# Patient Record
Sex: Female | Born: 1970 | Race: White | Hispanic: No | Smoking: Former smoker
Health system: Southern US, Community
[De-identification: ages and names within clinical notes are randomized; demographics above are authoritative.]

## PROBLEM LIST (undated history)

## (undated) DIAGNOSIS — D649 Anemia, unspecified: Secondary | ICD-10-CM

## (undated) DIAGNOSIS — G8929 Other chronic pain: Secondary | ICD-10-CM

## (undated) DIAGNOSIS — R569 Unspecified convulsions: Secondary | ICD-10-CM

## (undated) DIAGNOSIS — O2230 Deep phlebothrombosis in pregnancy, unspecified trimester: Secondary | ICD-10-CM

## (undated) DIAGNOSIS — M549 Dorsalgia, unspecified: Secondary | ICD-10-CM

## (undated) HISTORY — PX: LEG SURGERY: SHX1003

---

## 2007-10-25 ENCOUNTER — Emergency Department: Payer: Self-pay | Admitting: Emergency Medicine

## 2007-10-25 ENCOUNTER — Other Ambulatory Visit: Payer: Self-pay

## 2009-09-15 ENCOUNTER — Emergency Department: Payer: Self-pay | Admitting: Emergency Medicine

## 2010-10-05 ENCOUNTER — Emergency Department: Payer: Self-pay | Admitting: Emergency Medicine

## 2015-10-28 ENCOUNTER — Emergency Department: Payer: No Typology Code available for payment source

## 2015-10-28 ENCOUNTER — Encounter: Payer: Self-pay | Admitting: Emergency Medicine

## 2015-10-28 ENCOUNTER — Emergency Department
Admission: EM | Admit: 2015-10-28 | Discharge: 2015-10-28 | Disposition: A | Discharge status: Home / Self Care | Payer: No Typology Code available for payment source | Attending: Emergency Medicine | Admitting: Emergency Medicine

## 2015-10-28 DIAGNOSIS — S40011A Contusion of right shoulder, initial encounter: Secondary | ICD-10-CM | POA: Diagnosis not present

## 2015-10-28 DIAGNOSIS — Z87891 Personal history of nicotine dependence: Secondary | ICD-10-CM | POA: Insufficient documentation

## 2015-10-28 DIAGNOSIS — Y939 Activity, unspecified: Secondary | ICD-10-CM | POA: Diagnosis not present

## 2015-10-28 DIAGNOSIS — S7001XA Contusion of right hip, initial encounter: Secondary | ICD-10-CM

## 2015-10-28 DIAGNOSIS — S161XXA Strain of muscle, fascia and tendon at neck level, initial encounter: Secondary | ICD-10-CM | POA: Diagnosis not present

## 2015-10-28 DIAGNOSIS — S39012A Strain of muscle, fascia and tendon of lower back, initial encounter: Secondary | ICD-10-CM | POA: Diagnosis not present

## 2015-10-28 DIAGNOSIS — S199XXA Unspecified injury of neck, initial encounter: Secondary | ICD-10-CM | POA: Diagnosis present

## 2015-10-28 DIAGNOSIS — Y999 Unspecified external cause status: Secondary | ICD-10-CM | POA: Insufficient documentation

## 2015-10-28 DIAGNOSIS — Y929 Unspecified place or not applicable: Secondary | ICD-10-CM | POA: Insufficient documentation

## 2015-10-28 MED ORDER — HYDROCODONE-ACETAMINOPHEN 5-325 MG PO TABS: 1.0000 | ORAL_TABLET | Freq: Four times a day (QID) | ORAL | Status: DC | PRN

## 2015-10-28 MED ORDER — HYDROCODONE-ACETAMINOPHEN 5-325 MG PO TABS
1.0000 | ORAL_TABLET | ORAL | Status: AC
  Administered 2015-10-28: 1 via ORAL
  Filled 2015-10-28: qty 1

## 2015-10-28 MED ORDER — DIAZEPAM 5 MG PO TABS
5.0000 mg | ORAL_TABLET | Freq: Once | ORAL | Status: AC
  Administered 2015-10-28: 5 mg via ORAL
  Filled 2015-10-28: qty 1

## 2015-10-28 MED ORDER — IBUPROFEN 800 MG PO TABS: 800.0000 mg | ORAL_TABLET | Freq: Three times a day (TID) | ORAL | Status: DC | PRN

## 2015-10-28 MED ORDER — IBUPROFEN 800 MG PO TABS
800.0000 mg | ORAL_TABLET | Freq: Once | ORAL | Status: AC
  Administered 2015-10-28: 800 mg via ORAL
  Filled 2015-10-28: qty 1

## 2015-10-28 MED ORDER — DIAZEPAM 5 MG PO TABS: 5.0000 mg | ORAL_TABLET | Freq: Four times a day (QID) | ORAL | Status: DC | PRN
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
## 2016-12-22 ENCOUNTER — Inpatient Hospital Stay
Admission: EM | Admit: 2016-12-22 | Discharge: 2016-12-23 | DRG: 690 | Disposition: A | Discharge status: Home / Self Care | Payer: Self-pay | Attending: Internal Medicine | Admitting: Internal Medicine

## 2016-12-22 ENCOUNTER — Encounter: Payer: Self-pay | Admitting: Emergency Medicine

## 2016-12-22 ENCOUNTER — Emergency Department: Payer: Self-pay

## 2016-12-22 DIAGNOSIS — Z825 Family history of asthma and other chronic lower respiratory diseases: Secondary | ICD-10-CM

## 2016-12-22 DIAGNOSIS — R319 Hematuria, unspecified: Secondary | ICD-10-CM | POA: Diagnosis present

## 2016-12-22 DIAGNOSIS — N10 Acute tubulo-interstitial nephritis: Principal | ICD-10-CM | POA: Diagnosis present

## 2016-12-22 DIAGNOSIS — Z87891 Personal history of nicotine dependence: Secondary | ICD-10-CM

## 2016-12-22 DIAGNOSIS — B9620 Unspecified Escherichia coli [E. coli] as the cause of diseases classified elsewhere: Secondary | ICD-10-CM | POA: Diagnosis present

## 2016-12-22 DIAGNOSIS — N12 Tubulo-interstitial nephritis, not specified as acute or chronic: Secondary | ICD-10-CM

## 2016-12-22 DIAGNOSIS — E871 Hypo-osmolality and hyponatremia: Secondary | ICD-10-CM | POA: Diagnosis present

## 2016-12-22 DIAGNOSIS — E860 Dehydration: Secondary | ICD-10-CM | POA: Diagnosis present

## 2016-12-22 DIAGNOSIS — F329 Major depressive disorder, single episode, unspecified: Secondary | ICD-10-CM | POA: Diagnosis present

## 2016-12-22 HISTORY — DX: Dorsalgia, unspecified: M54.9

## 2016-12-22 HISTORY — DX: Anemia, unspecified: D64.9

## 2016-12-22 HISTORY — DX: Other chronic pain: G89.29

## 2016-12-22 HISTORY — DX: Unspecified convulsions: R56.9

## 2016-12-22 HISTORY — DX: Deep phlebothrombosis in pregnancy, unspecified trimester: O22.30

## 2016-12-22 LAB — URINALYSIS, COMPLETE (UACMP) WITH MICROSCOPIC
BILIRUBIN URINE: NEGATIVE
Glucose, UA: NEGATIVE mg/dL
KETONES UR: 80 mg/dL — AB
Nitrite: POSITIVE — AB
Protein, ur: 100 mg/dL — AB
Specific Gravity, Urine: 1.016 (ref 1.005–1.030)
pH: 7 (ref 5.0–8.0)

## 2016-12-22 LAB — CBC
HCT: 36.4 % (ref 35.0–47.0)
HEMOGLOBIN: 12.6 g/dL (ref 12.0–16.0)
MCH: 31.7 pg (ref 26.0–34.0)
MCHC: 34.6 g/dL (ref 32.0–36.0)
MCV: 91.8 fL (ref 80.0–100.0)
Platelets: 215 10*3/uL (ref 150–440)
RBC: 3.96 MIL/uL (ref 3.80–5.20)
RDW: 12 % (ref 11.5–14.5)
WBC: 13.5 10*3/uL — ABNORMAL HIGH (ref 3.6–11.0)

## 2016-12-22 LAB — COMPREHENSIVE METABOLIC PANEL
ALBUMIN: 3.7 g/dL (ref 3.5–5.0)
ALT: 16 U/L (ref 14–54)
ANION GAP: 9 (ref 5–15)
AST: 19 U/L (ref 15–41)
Alkaline Phosphatase: 54 U/L (ref 38–126)
BUN: 9 mg/dL (ref 6–20)
CHLORIDE: 103 mmol/L (ref 101–111)
CO2: 20 mmol/L — ABNORMAL LOW (ref 22–32)
CREATININE: 0.58 mg/dL (ref 0.44–1.00)
Calcium: 8.8 mg/dL — ABNORMAL LOW (ref 8.9–10.3)
GFR calc non Af Amer: >60 mL/min (ref >60)
Glucose, Bld: 124 mg/dL — ABNORMAL HIGH (ref 65–99)
Potassium: 3.5 mmol/L (ref 3.5–5.1)
Sodium: 132 mmol/L — ABNORMAL LOW (ref 135–145)
Total Bilirubin: 1.4 mg/dL — ABNORMAL HIGH (ref 0.3–1.2)
Total Protein: 7.6 g/dL (ref 6.5–8.1)

## 2016-12-22 LAB — LIPASE, BLOOD: LIPASE: 21 U/L (ref 11–51)

## 2016-12-22 LAB — LACTIC ACID, PLASMA: LACTIC ACID, VENOUS: 0.6 mmol/L (ref 0.5–1.9)

## 2016-12-22 MED ORDER — ONDANSETRON HCL 4 MG PO TABS: 4.0000 mg | ORAL_TABLET | Freq: Four times a day (QID) | ORAL | Status: DC | PRN

## 2016-12-22 MED ORDER — POLYETHYLENE GLYCOL 3350 17 G PO PACK: 17.0000 g | PACK | Freq: Every day | ORAL | Status: DC | PRN

## 2016-12-22 MED ORDER — DEXTROSE 5 % IV SOLN
2.0000 g | Freq: Once | INTRAVENOUS | Status: AC
  Administered 2016-12-22: 2 g via INTRAVENOUS
  Filled 2016-12-22: qty 2

## 2016-12-22 MED ORDER — ENOXAPARIN SODIUM 40 MG/0.4ML ~~LOC~~ SOLN
40.0000 mg | SUBCUTANEOUS | Status: DC
  Filled 2016-12-22: qty 0.4

## 2016-12-22 MED ORDER — SODIUM CHLORIDE 0.9 % IV SOLN
Freq: Once | INTRAVENOUS | Status: AC
  Administered 2016-12-22: 14:00:00 via INTRAVENOUS

## 2016-12-22 MED ORDER — ACETAMINOPHEN 325 MG PO TABS
650.0000 mg | ORAL_TABLET | Freq: Four times a day (QID) | ORAL | Status: DC | PRN
  Administered 2016-12-22: 650 mg via ORAL
  Filled 2016-12-22: qty 2

## 2016-12-22 MED ORDER — ALBUTEROL SULFATE (2.5 MG/3ML) 0.083% IN NEBU: 2.5000 mg | INHALATION_SOLUTION | RESPIRATORY_TRACT | Status: DC | PRN

## 2016-12-22 MED ORDER — ONDANSETRON HCL 4 MG/2ML IJ SOLN: 4.0000 mg | Freq: Four times a day (QID) | INTRAMUSCULAR | Status: DC | PRN

## 2016-12-22 MED ORDER — POTASSIUM CHLORIDE IN NACL 20-0.9 MEQ/L-% IV SOLN
INTRAVENOUS | Status: DC
  Administered 2016-12-22 – 2016-12-23 (×2): via INTRAVENOUS
  Filled 2016-12-22 (×3): qty 1000

## 2016-12-22 MED ORDER — TRAMADOL HCL 50 MG PO TABS: 50.0000 mg | ORAL_TABLET | Freq: Four times a day (QID) | ORAL | Status: DC | PRN

## 2016-12-22 MED ORDER — SODIUM CHLORIDE 0.9 % IV BOLUS (SEPSIS)
1000.0000 mL | Freq: Once | INTRAVENOUS | Status: AC
  Administered 2016-12-22: 1000 mL via INTRAVENOUS

## 2016-12-22 MED ORDER — MORPHINE SULFATE (PF) 4 MG/ML IV SOLN
4.0000 mg | Freq: Once | INTRAVENOUS | Status: AC
  Administered 2016-12-22: 4 mg via INTRAVENOUS

## 2016-12-22 MED ORDER — IOPAMIDOL (ISOVUE-300) INJECTION 61%
100.0000 mL | Freq: Once | INTRAVENOUS | Status: AC | PRN
  Administered 2016-12-22: 100 mL via INTRAVENOUS

## 2016-12-22 MED ORDER — KETOROLAC TROMETHAMINE 30 MG/ML IJ SOLN
30.0000 mg | Freq: Four times a day (QID) | INTRAMUSCULAR | Status: DC | PRN
  Administered 2016-12-22 – 2016-12-23 (×2): 30 mg via INTRAVENOUS
  Filled 2016-12-22 (×2): qty 1

## 2016-12-22 MED ORDER — ACETAMINOPHEN 650 MG RE SUPP: 650.0000 mg | Freq: Four times a day (QID) | RECTAL | Status: DC | PRN

## 2016-12-22 MED ORDER — DEXTROSE 5 % IV SOLN
1.0000 g | INTRAVENOUS | Status: DC
  Filled 2016-12-22: qty 10

## 2016-12-22 MED ORDER — ENSURE ENLIVE PO LIQD
237.0000 mL | Freq: Two times a day (BID) | ORAL | Status: DC
  Administered 2016-12-22: 237 mL via ORAL

## 2016-12-22 MED ORDER — DULOXETINE HCL 60 MG PO CPEP: 120.0000 mg | ORAL_CAPSULE | Freq: Every day | ORAL | Status: DC

## 2016-12-22 MED ORDER — ONDANSETRON HCL 4 MG/2ML IJ SOLN
4.0000 mg | Freq: Once | INTRAMUSCULAR | Status: AC
  Administered 2016-12-22: 4 mg via INTRAVENOUS

## 2016-12-22 NOTE — ED Triage Notes (Signed)
Pt here with c/o cough, fever N,V for the past few days, was sent here from Dr office for fluids and to be started on antibiotic for UTI. Pt has been unable to keep anything down since yesterday am.

## 2016-12-22 NOTE — H&P (Signed)
[facility] - [place] [facility] [place]   PATIENT NAME: Kristi Lyons    MR#:  [id number]  DATE OF BIRTH:  [birth year]  DATE OF ADMISSION:  12/22/2016  PRIMARY CARE PHYSICIAN: Evie Lacks, NP   REQUESTING/REFERRING PHYSICIAN: Dr. Mayford Knife  CHIEF COMPLAINT:   Chief Complaint  Patient presents with  . Fever  . Cough  . Emesis    HISTORY OF PRESENT ILLNESS:  Kristi Lyons  is a 46 y.o. female with a known history of Depression, chronic back pain presents to the hospital complaining of 3-4 days of nausea, vomiting and right-sided back pain. Patient was seen by her primary care physician today in the office and found to have a UTI and referred to the emergency room. Patient has also had intractable vomiting and been able to keep only a few sips of water. Had dark urine. Some blood in the urine. Afebrile. Here patient has been found to have leukocytosis along with tachycardia. CT scan of the abdomen and pelvis showed right pyelonephritis. Sepsis present on admission. Admit inpatient.  PAST MEDICAL HISTORY:   Past Medical History:  Diagnosis Date  . Anemia   . Chronic back pain   . DVT (deep vein thrombosis) in pregnancy (HCC)   . Seizure (HCC)     PAST SURGICAL HISTORY:   Past Surgical History:  Procedure Laterality Date  . LEG SURGERY    . LEG SURGERY      SOCIAL HISTORY:   Social History  Substance Use Topics  . Smoking status: Former Games developer  . Smokeless tobacco: Never Used  . Alcohol use No    FAMILY HISTORY:   Family History  Problem Relation Age of Onset  . Cancer Mother   . COPD Father     DRUG ALLERGIES:  No Known Allergies  REVIEW OF SYSTEMS:   ROS  MEDICATIONS AT HOME:   Prior to Admission medications   Medication Sig Start Date End Date Taking? Authorizing Provider  acetaminophen (TYLENOL) 500 MG tablet Take 500 mg by mouth every 6 (six) hours as needed.   Yes [provider]  DULoxetine (CYMBALTA) 60 MG capsule Take 120  mg by mouth daily.   Yes [provider]  diazepam (VALIUM) 5 MG tablet Take 1 tablet (5 mg total) by mouth every 6 (six) hours as needed for anxiety. Patient not taking: Reported on 12/22/2016 10/28/15   Evon Slack, PA-C  HYDROcodone-acetaminophen [facility]) 5-325 MG tablet Take 1 tablet by mouth every 6 (six) hours as needed for moderate pain. Patient not taking: Reported on 12/22/2016 10/28/15   Evon Slack, PA-C  ibuprofen (ADVIL,MOTRIN) 800 MG tablet Take 1 tablet (800 mg total) by mouth every 8 (eight) hours as needed. Patient not taking: Reported on 12/22/2016 10/28/15   Evon Slack, PA-C     VITAL SIGNS:  Blood pressure 111/72, pulse 88, temperature 98.6 F (37 C), temperature source Oral, resp. rate 16, height 5\' 7"  (1.702 m), weight 57.2 kg (126 lb), last menstrual period 12/04/2016, SpO2 99 %.  PHYSICAL EXAMINATION:  Physical Exam  GENERAL:  46 y.o.-year-old patient lying in the bed with no acute distress.  EYES: Pupils equal, round, reactive to light and accommodation. No scleral icterus. Extraocular muscles intact.  HEENT: Head atraumatic, normocephalic. Oropharynx and nasopharynx clear. No oropharyngeal erythema, dry oral mucosa  NECK:  Supple, no jugular venous distention. No thyroid enlargement, no tenderness.  LUNGS: Normal breath sounds bilaterally, no wheezing, rales, rhonchi. No use of accessory muscles  of respiration.  CARDIOVASCULAR: S1, S2 normal. No murmurs, rubs, or gallops.  ABDOMEN: Soft, nontender, nondistended. Bowel sounds present. No organomegaly or mass. Right CVA tenderness EXTREMITIES: No pedal edema, cyanosis, or clubbing. + 2 pedal & radial pulses b/l.   NEUROLOGIC: Cranial nerves II through XII are intact. No focal Motor or sensory deficits appreciated b/l PSYCHIATRIC: The patient is alert and oriented x 3. Good affect.  SKIN: No obvious rash, lesion, or ulcer.   LABORATORY PANEL:   CBC  Recent Labs Lab 12/22/16 1351  WBC 13.5*   HGB 12.6  HCT 36.4  PLT 215   ------------------------------------------------------------------------------------------------------------------  Chemistries   Recent Labs Lab 12/22/16 1351  NA 132*  K 3.5  CL 103  CO2 20*  GLUCOSE 124*  BUN 9  CREATININE 0.58  CALCIUM 8.8*  AST 19  ALT 16  ALKPHOS 54  BILITOT 1.4*   ------------------------------------------------------------------------------------------------------------------  Cardiac Enzymes No results for input(s): TROPONINI in the last 168 hours. ------------------------------------------------------------------------------------------------------------------  RADIOLOGY:  Dg Chest 2 View  Result Date: 12/22/2016 CLINICAL DATA:  Cough, fever, nausea and vomiting for the past few days. EXAM: CHEST  2 VIEW COMPARISON:  CT chest and PA and lateral chest 10/25/2007. FINDINGS: The lungs are clear. Heart size is normal. No pneumothorax or pleural fluid. No bony abnormality. IMPRESSION: Negative chest. Electronically Signed   By: Drusilla Kannerhomas  Dalessio M.D.   On: 12/22/2016 15:00   Ct Abdomen Pelvis W Contrast  Result Date: 12/22/2016 CLINICAL DATA:  Cough, fever, nausea, vomiting EXAM: CT ABDOMEN AND PELVIS WITH CONTRAST TECHNIQUE: Multidetector CT imaging of the abdomen and pelvis was performed using the standard protocol following bolus administration of intravenous contrast. CONTRAST:  100mL ISOVUE-300 IOPAMIDOL (ISOVUE-300) INJECTION 61% COMPARISON:  None. FINDINGS: Lower chest: No acute abnormality. Hepatobiliary: No focal liver abnormality is seen. No gallstones, gallbladder wall thickening, or biliary dilatation. Pancreas: Unremarkable. No pancreatic ductal dilatation or surrounding inflammatory changes. Spleen: Normal in size without focal abnormality. Adrenals/Urinary Tract: Adrenal glands are normal. Left kidney is normal. Mild right nephromegaly with wedge-shaped areas of hypoenhancement involving the inferior pole of the  right kidney with perinephric fat stranding most concerning for pyelonephritis. No obstructive uropathy. Normal decompressed bladder. Stomach/Bowel: Stomach is within normal limits. Appendix appears normal. No evidence of bowel wall thickening, distention, or inflammatory changes. Vascular/Lymphatic: Abdominal aortic atherosclerosis. Normal caliber abdominal aorta. retroaortic left renal vein. No lymphadenopathy. Reproductive: Uterus and bilateral adnexa are unremarkable. Other: No abdominal wall hernia or abnormality. No abdominopelvic ascites. Musculoskeletal: No acute osseous abnormality. No lytic or sclerotic osseous lesion. IMPRESSION: 1. Findings most concerning for right sided pyelonephritis. No drainable fluid collection to suggest an abscess. Electronically Signed   By: Elige KoHetal  Patel   On: 12/22/2016 15:12     IMPRESSION AND PLAN:   * Acute right pyelonephritis. Leukocytosis and tachycardia. Sepsis present on admission. We will bolus and another liter of fluid. Check lactic acid. Start IV ceftriaxone. Continue maintenance IV fluids. Wait for urine cultures.  * Urinary frequency. This has been going on for a few weeks. But glucose is mildly elevated. Check hemoglobin A1c. Symptoms could be just due to UTI.  * Depression. Continue Cymbalta.  * Vomiting likely due to pyelonephritis. No acute findings in the stomach or bowel on CT scan.  * Hyponatremia due to dehydration. Should improve with IV fluids.  DVT prophylaxis with Lovenox.  All the records are reviewed and case discussed with ED provider. Management plans discussed with the patient, family and they are  in agreement.  CODE STATUS: FULL CODE  TOTAL TIME TAKING CARE OF THIS PATIENT: 40 minutes.   Milagros Loll R M.D on 12/22/2016 at 4:25 PM  Between 7am to 6pm - Pager - [phone]  After 6pm go to [email or web address] - password [email or web address]  [facility]  Office  [phone]  CC: Primary care physician;  Evie Lacks, NP  Note: This dictation was prepared with Dragon dictation along with smaller phrase technology. Any transcriptional errors that result from this process are unintentional.

## 2016-12-22 NOTE — ED Provider Notes (Signed)
[facility] Emergency Department Provider Note       Time seen: ----------------------------------------- 2:04 PM on 12/22/2016 -----------------------------------------     I have reviewed the triage vital signs and the nursing notes.   HISTORY   Chief Complaint Fever; Cough; and Emesis    HPI Kristi Lyons is a 46 y.o. female who presents to the ED for cough, fever, nausea vomiting for the past few days. Patient was sent here from her doctor's office for fluids to be started because she has not kept anything down since chest today morning. She describes pain as 10 out of 10 and the worst ever. Patient thinks she may have seen some blood in her urine, her doctor has started treating her for a UTI. She also has back pain.   History reviewed. No pertinent past medical history.  There are no active problems to display for this patient.   Past Surgical History:  Procedure Laterality Date  . LEG SURGERY    . LEG SURGERY      Allergies Patient has no known allergies.  Social History Social History  Substance Use Topics  . Smoking status: Former Games developer  . Smokeless tobacco: Never Used  . Alcohol use No    Review of Systems Constitutional: Negative for fever. Cardiovascular: Negative for chest pain. Respiratory: Negative for shortness of breath.Positive for cough Gastrointestinal: Positive for abdominal pain and vomiting Genitourinary: Negative for dysuria. Musculoskeletal: Positive for back pain Skin: Negative for rash. Neurological: Negative for headaches, focal weakness or numbness.  All systems negative/normal/unremarkable except as stated in the HPI  ____________________________________________   PHYSICAL EXAM:  VITAL SIGNS: ED Triage Vitals  Enc Vitals Group     BP 12/22/16 1350 (!) 134/97     Pulse Rate 12/22/16 1350 95     Resp 12/22/16 1350 18     Temp 12/22/16 1350 98.6 F (37 C)     Temp Source 12/22/16 1350 Oral   SpO2 12/22/16 1350 100 %     Weight 12/22/16 1350 126 lb (57.2 kg)     Height 12/22/16 1350 5\' 7"  (1.702 m)     Head Circumference --      Peak Flow --      Pain Score 12/22/16 1349 10     Pain Loc --      Pain Edu? --      Excl. in GC? --    Constitutional: Alert and oriented. Mild distress Eyes: Conjunctivae are normal. Normal extraocular movements. ENT   Head: Normocephalic and atraumatic.   Nose: No congestion/rhinnorhea.   Mouth/Throat: Mucous membranes are moist.   Neck: No stridor. Cardiovascular: Rapid rate, regular rhythm. No murmurs, rubs, or gallops. Respiratory: Normal respiratory effort without tachypnea nor retractions. Breath sounds are clear and equal bilaterally. No wheezes/rales/rhonchi. Gastrointestinal: Nonfocal abdominal tenderness, no rebound or guarding. Normal bowel sounds. Musculoskeletal: Nontender with normal range of motion in extremities. No lower extremity tenderness nor edema. Right CVA tenderness is noted Neurologic:  Normal speech and language. No gross focal neurologic deficits are appreciated.  Skin:  Skin is warm, dry and intact. No rash noted. Psychiatric: Mood and affect are normal. Speech and behavior are normal.  ____________________________________________  ED COURSE:  Pertinent labs & imaging results that were available during my care of the patient were reviewed by me and considered in my medical decision making (see chart for details). Patient presents for fever, vomiting and recent UTI, we will assess with labs and imaging as indicated. Possible  pyelonephritis   Procedures ____________________________________________   LABS (pertinent positives/negatives)  Labs Reviewed  COMPREHENSIVE METABOLIC PANEL - Abnormal; Notable for the following:       Result Value   Sodium 132 (*)    CO2 20 (*)    Glucose, Bld 124 (*)    Calcium 8.8 (*)    Total Bilirubin 1.4 (*)    All other components within normal limits  CBC -  Abnormal; Notable for the following:    WBC 13.5 (*)    All other components within normal limits  URINALYSIS, COMPLETE (UACMP) WITH MICROSCOPIC - Abnormal; Notable for the following:    Color, Urine AMBER (*)    APPearance CLOUDY (*)    Hgb urine dipstick LARGE (*)    Ketones, ur 80 (*)    Protein, ur 100 (*)    Nitrite POSITIVE (*)    Leukocytes, UA MODERATE (*)    Bacteria, UA MANY (*)    Squamous Epithelial / LPF 0-5 (*)    All other components within normal limits  URINE CULTURE  LIPASE, BLOOD    RADIOLOGY Images were viewed by me  CT of the abdomen and pelvis with contrast IMPRESSION: 1. Findings most concerning for right sided pyelonephritis. No drainable fluid collection to suggest an abscess. ____________________________________________  FINAL ASSESSMENT AND PLAN  Abdominal pain, Pyelonephritis  Plan: Patient's labs and imaging were dictated above. Patient had presented for fevers, chills, nausea, vomiting and flank pain. Patient clinically had pyelonephritis confirmed by labs and CT imaging. We have started her on IV antibiotics, obtain urine culture and the patient's family is requesting hospital observation. I will discuss with the hospitalist for admission.   Emily FilbertWilliams, Jonathan E, MD   Note: This note was generated in part or whole with voice recognition software. Voice recognition is usually quite accurate but there are transcription errors that can and very often do occur. I apologize for any typographical errors that were not detected and corrected.     Emily FilbertWilliams, Jonathan E, MD 12/22/16 [id number]

## 2016-12-23 LAB — BASIC METABOLIC PANEL
Anion gap: 3 — ABNORMAL LOW (ref 5–15)
BUN: 10 mg/dL (ref 6–20)
CALCIUM: 7.9 mg/dL — AB (ref 8.9–10.3)
CO2: 24 mmol/L (ref 22–32)
Chloride: 110 mmol/L (ref 101–111)
Creatinine, Ser: 0.66 mg/dL (ref 0.44–1.00)
GFR calc Af Amer: >60 mL/min (ref >60)
Glucose, Bld: 129 mg/dL — ABNORMAL HIGH (ref 65–99)
POTASSIUM: 4 mmol/L (ref 3.5–5.1)
SODIUM: 137 mmol/L (ref 135–145)

## 2016-12-23 LAB — CBC
HEMATOCRIT: 32 % — AB (ref 35.0–47.0)
Hemoglobin: 11 g/dL — ABNORMAL LOW (ref 12.0–16.0)
MCH: 31.7 pg (ref 26.0–34.0)
MCHC: 34.3 g/dL (ref 32.0–36.0)
MCV: 92.4 fL (ref 80.0–100.0)
Platelets: 169 10*3/uL (ref 150–440)
RBC: 3.46 MIL/uL — ABNORMAL LOW (ref 3.80–5.20)
RDW: 12 % (ref 11.5–14.5)
WBC: 9.8 10*3/uL (ref 3.6–11.0)

## 2016-12-23 LAB — HEMOGLOBIN A1C
Hgb A1c MFr Bld: 5.2 % (ref 4.8–5.6)
MEAN PLASMA GLUCOSE: 103 mg/dL

## 2016-12-23 MED ORDER — CIPROFLOXACIN HCL 500 MG PO TABS: 500.0000 mg | ORAL_TABLET | Freq: Two times a day (BID) | ORAL | 0 refills | Status: AC

## 2016-12-23 MED ORDER — HYDROCODONE-ACETAMINOPHEN 5-325 MG PO TABS: 1.0000 | ORAL_TABLET | Freq: Four times a day (QID) | ORAL | 0 refills | Status: AC | PRN

## 2016-12-23 NOTE — Progress Notes (Signed)
Pt given discharge instructions including f/u appointments and prescriptions. Discharge home with family.

## 2016-12-23 NOTE — Discharge Instructions (Signed)
Resume diet and activity as before.   Call your doctor or return to ER if any Fever, worsening pain

## 2016-12-24 LAB — URINE CULTURE
Culture: >=100000 — AB
Special Requests: NORMAL

## 2016-12-24 LAB — HIV ANTIBODY (ROUTINE TESTING W REFLEX): HIV Screen 4th Generation wRfx: NONREACTIVE

## 2016-12-27 LAB — CULTURE, BLOOD (ROUTINE X 2)
CULTURE: NO GROWTH
Culture: NO GROWTH
SPECIAL REQUESTS: ADEQUATE
Special Requests: ADEQUATE

## 2016-12-29 NOTE — Discharge Summary (Signed)
[facility] - [place] [facility]   PATIENT NAME: Kristi Lyons    MR#:  [id number]  DATE OF BIRTH:  1971-01-03  DATE OF ADMISSION:  12/22/2016 ADMITTING PHYSICIAN: Milagros LollSrikar Mirl Hillery, MD  DATE OF DISCHARGE: 12/23/2016 10:26 AM  PRIMARY CARE PHYSICIAN: Evie LacksPointer, Elizabeth, NP   ADMISSION DIAGNOSIS:  Pyelonephritis [N12]  DISCHARGE DIAGNOSIS:  Active Problems:   Pyelonephritis   SECONDARY DIAGNOSIS:   Past Medical History:  Diagnosis Date  . Anemia   . Chronic back pain   . DVT (deep vein thrombosis) in pregnancy (HCC)   . Seizure (HCC)      ADMITTING HISTORY  HISTORY OF PRESENT ILLNESS:  Kristi Lyons  is a 46 y.o. female with a known history of Depression, chronic back pain presents to the hospital complaining of 3-4 days of nausea, vomiting and right-sided back pain. Patient was seen by her primary care physician today in the office and found to have a UTI and referred to the emergency room. Patient has also had intractable vomiting and been able to keep only a few sips of water. Had dark urine. Some blood in the urine. Afebrile. Here patient has been found to have leukocytosis along with tachycardia. CT scan of the abdomen and pelvis showed right pyelonephritis. Sepsis present on admission. Admit inpatient.   HOSPITAL COURSE:   * Acute right pyelonephritis with Escherichia coli Started on IV ceftriaxone on admission. Patient improved well.  By the day of discharge her pain is improved.  Afebrile.  She is being switched to ciprofloxacin for 1 more week.  * Chronic urinary frequency. Hemoglobin A1c was checked and normal.  * hypovolemic hyponatremia due to vomiting resolved with IV fluids.  Patient has no abdominal pain or vomiting at the time of discharge and is afebrile.  Discharged in stable condition back home to follow-up with primary care physician. CONSULTS OBTAINED:   DRUG ALLERGIES:  No Known Allergies  DISCHARGE MEDICATIONS:   Discharge Medication  List as of 12/23/2016  9:40 AM    START taking these medications   Details  ciprofloxacin (CIPRO) 500 MG tablet Take 1 tablet (500 mg total) by mouth 2 (two) times daily., Starting Wed 12/23/2016, Until Sat 01/02/2017, Print      CONTINUE these medications which have CHANGED   Details  HYDROcodone-acetaminophen (NORCO/VICODIN) 5-325 MG tablet Take 1 tablet by mouth every 6 (six) hours as needed for moderate pain., Starting Wed 12/23/2016, Until Thu 12/23/2017, Print      CONTINUE these medications which have NOT CHANGED   Details  acetaminophen (TYLENOL) 500 MG tablet Take 500 mg by mouth every 6 (six) hours as needed., Historical Med    DULoxetine (CYMBALTA) 60 MG capsule Take 120 mg by mouth daily., Historical Med      STOP taking these medications     diazepam (VALIUM) 5 MG tablet      ibuprofen (ADVIL,MOTRIN) 800 MG tablet         Today   VITAL SIGNS:  Blood pressure (!) 110/58, pulse 85, temperature 98.8 F (37.1 C), temperature source Oral, resp. rate 20, height 5\' 7"  (1.702 m), weight 61.7 kg (136 lb 1.6 oz), last menstrual period 12/04/2016, SpO2 97 %.  I/O:  No intake or output data in the 24 hours ending 12/29/16 1615  PHYSICAL EXAMINATION:  Physical Exam  GENERAL:  46 y.o.-year-old patient lying in the bed with no acute distress.  LUNGS: Normal breath sounds bilaterally, no wheezing, rales,rhonchi or crepitation. No use of accessory  muscles of respiration.  CARDIOVASCULAR: S1, S2 normal. No murmurs, rubs, or gallops.  ABDOMEN: Soft, non-tender, non-distended. Bowel sounds present. No organomegaly or mass.  NEUROLOGIC: Moves all 4 extremities. PSYCHIATRIC: The patient is alert and oriented x 3.  SKIN: No obvious rash, lesion, or ulcer.   DATA REVIEW:   CBC  Recent Labs Lab 12/23/16 0555  WBC 9.8  HGB 11.0*  HCT 32.0*  PLT 169    Chemistries   Recent Labs Lab 12/23/16 0555  NA 137  K 4.0  CL 110  CO2 24  GLUCOSE 129*  BUN 10  CREATININE 0.66   CALCIUM 7.9*    Cardiac Enzymes No results for input(s): TROPONINI in the last 168 hours.  Microbiology Results  Results for orders placed or performed during the hospital encounter of 12/22/16  Urine culture     Status: Abnormal   Collection Time: 12/22/16  1:51 PM  Result Value Ref Range Status   Specimen Description URINE, CLEAN CATCH  Final   Special Requests Normal  Final   Culture >=100,000 COLONIES/mL ESCHERICHIA COLI (A)  Final   Report Status 12/24/2016 FINAL  Final   Organism ID, Bacteria ESCHERICHIA COLI (A)  Final      Susceptibility   Escherichia coli - MIC*    AMPICILLIN >=32 RESISTANT Resistant     CEFAZOLIN <=4 SENSITIVE Sensitive     CEFTRIAXONE <=1 SENSITIVE Sensitive     CIPROFLOXACIN <=0.25 SENSITIVE Sensitive     GENTAMICIN <=1 SENSITIVE Sensitive     IMIPENEM <=0.25 SENSITIVE Sensitive     NITROFURANTOIN <=16 SENSITIVE Sensitive     TRIMETH/SULFA <=20 SENSITIVE Sensitive     AMPICILLIN/SULBACTAM >=32 RESISTANT Resistant     PIP/TAZO <=4 SENSITIVE Sensitive     Extended ESBL NEGATIVE Sensitive     * >=100,000 COLONIES/mL ESCHERICHIA COLI  CULTURE, BLOOD (ROUTINE X 2) w Reflex to ID Panel     Status: None   Collection Time: 12/22/16  5:20 PM  Result Value Ref Range Status   Specimen Description BLOOD LEFT ANTECUBITAL  Final   Special Requests   Final    BOTTLES DRAWN AEROBIC AND ANAEROBIC Blood Culture adequate volume   Culture NO GROWTH 5 DAYS  Final   Report Status 12/27/2016 FINAL  Final  CULTURE, BLOOD (ROUTINE X 2) w Reflex to ID Panel     Status: None   Collection Time: 12/22/16  5:32 PM  Result Value Ref Range Status   Specimen Description BLOOD RIGHT ANTECUBITAL  Final   Special Requests   Final    BOTTLES DRAWN AEROBIC AND ANAEROBIC Blood Culture adequate volume   Culture NO GROWTH 5 DAYS  Final   Report Status 12/27/2016 FINAL  Final    RADIOLOGY:  No results found.  Follow up with PCP in 1 week.  Management plans discussed with the  patient, family and they are in agreement.  CODE STATUS:  Code Status History    Date Active Date Inactive Code Status Order ID Comments User Context   12/22/2016  4:24 PM 12/23/2016  2:44 PM Full Code [id number]  Milagros Loll, MD ED      TOTAL TIME TAKING CARE OF THIS PATIENT ON DAY OF DISCHARGE: more than 30 minutes.   Milagros Loll R M.D on 12/29/2016 at 4:15 PM  Between 7am to 6pm - Pager - [phone]  After 6pm go to [email or web address] - password [email or web address] [facility]  [facility]  Office  [phone]  CC: Primary care physician; [place],  Lanora Manis, NP  Note: This dictation was prepared with Dragon dictation along with smaller phrase technology. Any transcriptional errors that result from this process are unintentional.

## 2017-10-08 ENCOUNTER — Encounter: Payer: Self-pay | Admitting: Emergency Medicine

## 2017-10-08 ENCOUNTER — Emergency Department: Payer: No Typology Code available for payment source

## 2017-10-08 ENCOUNTER — Emergency Department
Admission: EM | Admit: 2017-10-08 | Discharge: 2017-10-08 | Disposition: A | Discharge status: Home / Self Care | Payer: No Typology Code available for payment source | Attending: Emergency Medicine | Admitting: Emergency Medicine

## 2017-10-08 DIAGNOSIS — Z87891 Personal history of nicotine dependence: Secondary | ICD-10-CM | POA: Diagnosis not present

## 2017-10-08 DIAGNOSIS — Y999 Unspecified external cause status: Secondary | ICD-10-CM | POA: Diagnosis not present

## 2017-10-08 DIAGNOSIS — Y92410 Unspecified street and highway as the place of occurrence of the external cause: Secondary | ICD-10-CM | POA: Insufficient documentation

## 2017-10-08 DIAGNOSIS — S199XXA Unspecified injury of neck, initial encounter: Secondary | ICD-10-CM | POA: Diagnosis present

## 2017-10-08 DIAGNOSIS — S161XXA Strain of muscle, fascia and tendon at neck level, initial encounter: Secondary | ICD-10-CM | POA: Insufficient documentation

## 2017-10-08 DIAGNOSIS — Y9389 Activity, other specified: Secondary | ICD-10-CM | POA: Diagnosis not present

## 2017-10-08 DIAGNOSIS — R51 Headache: Secondary | ICD-10-CM | POA: Insufficient documentation

## 2017-10-08 DIAGNOSIS — R519 Headache, unspecified: Secondary | ICD-10-CM

## 2017-10-08 DIAGNOSIS — Z79899 Other long term (current) drug therapy: Secondary | ICD-10-CM | POA: Insufficient documentation

## 2017-10-08 MED ORDER — SODIUM CHLORIDE 0.9 % IV BOLUS
1000.0000 mL | Freq: Once | INTRAVENOUS | Status: AC
  Administered 2017-10-08: 1000 mL via INTRAVENOUS

## 2017-10-08 MED ORDER — DIPHENHYDRAMINE HCL 50 MG/ML IJ SOLN
50.0000 mg | Freq: Once | INTRAMUSCULAR | Status: AC
  Administered 2017-10-08: 50 mg via INTRAVENOUS
  Filled 2017-10-08: qty 1

## 2017-10-08 MED ORDER — KETOROLAC TROMETHAMINE 30 MG/ML IJ SOLN
30.0000 mg | Freq: Once | INTRAMUSCULAR | Status: AC
  Administered 2017-10-08: 30 mg via INTRAVENOUS
  Filled 2017-10-08: qty 1

## 2017-10-08 MED ORDER — METOCLOPRAMIDE HCL 5 MG/ML IJ SOLN
10.0000 mg | Freq: Once | INTRAMUSCULAR | Status: AC
  Administered 2017-10-08: 10 mg via INTRAVENOUS
  Filled 2017-10-08: qty 2
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
## 2017-10-08 NOTE — ED Notes (Addendum)
Patient transported to XR. 
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
## 2022-06-05 DIAGNOSIS — Z419 Encounter for procedure for purposes other than remedying health state, unspecified: Secondary | ICD-10-CM | POA: Diagnosis not present

## 2022-07-02 DIAGNOSIS — G8929 Other chronic pain: Secondary | ICD-10-CM | POA: Diagnosis not present

## 2022-07-02 DIAGNOSIS — F331 Major depressive disorder, recurrent, moderate: Secondary | ICD-10-CM | POA: Diagnosis not present

## 2022-07-02 DIAGNOSIS — H539 Unspecified visual disturbance: Secondary | ICD-10-CM | POA: Diagnosis not present

## 2022-07-02 DIAGNOSIS — F902 Attention-deficit hyperactivity disorder, combined type: Secondary | ICD-10-CM | POA: Diagnosis not present

## 2022-07-02 DIAGNOSIS — Z1389 Encounter for screening for other disorder: Secondary | ICD-10-CM | POA: Diagnosis not present

## 2022-07-02 DIAGNOSIS — F4310 Post-traumatic stress disorder, unspecified: Secondary | ICD-10-CM | POA: Diagnosis not present

## 2022-07-16 ENCOUNTER — Telehealth: Payer: Self-pay

## 2022-07-16 NOTE — Telephone Encounter (Signed)
Mychart msg sent. AS, CMA 

## 2022-08-06 DIAGNOSIS — Z419 Encounter for procedure for purposes other than remedying health state, unspecified: Secondary | ICD-10-CM | POA: Diagnosis not present

## 2022-09-04 DIAGNOSIS — Z419 Encounter for procedure for purposes other than remedying health state, unspecified: Secondary | ICD-10-CM | POA: Diagnosis not present

## 2022-10-05 DIAGNOSIS — Z419 Encounter for procedure for purposes other than remedying health state, unspecified: Secondary | ICD-10-CM | POA: Diagnosis not present

## 2022-10-14 DIAGNOSIS — R32 Unspecified urinary incontinence: Secondary | ICD-10-CM | POA: Diagnosis not present

## 2022-10-14 DIAGNOSIS — Z1389 Encounter for screening for other disorder: Secondary | ICD-10-CM | POA: Diagnosis not present

## 2022-10-14 DIAGNOSIS — N814 Uterovaginal prolapse, unspecified: Secondary | ICD-10-CM | POA: Diagnosis not present

## 2022-10-14 DIAGNOSIS — M25521 Pain in right elbow: Secondary | ICD-10-CM | POA: Diagnosis not present

## 2022-10-14 DIAGNOSIS — F331 Major depressive disorder, recurrent, moderate: Secondary | ICD-10-CM | POA: Diagnosis not present

## 2022-10-14 DIAGNOSIS — F4329 Adjustment disorder with other symptoms: Secondary | ICD-10-CM | POA: Diagnosis not present

## 2022-10-14 DIAGNOSIS — F4310 Post-traumatic stress disorder, unspecified: Secondary | ICD-10-CM | POA: Diagnosis not present

## 2022-10-14 DIAGNOSIS — F902 Attention-deficit hyperactivity disorder, combined type: Secondary | ICD-10-CM | POA: Diagnosis not present

## 2022-10-14 DIAGNOSIS — R634 Abnormal weight loss: Secondary | ICD-10-CM | POA: Diagnosis not present

## 2022-10-29 DIAGNOSIS — Z0000 Encounter for general adult medical examination without abnormal findings: Secondary | ICD-10-CM | POA: Diagnosis not present

## 2022-10-29 DIAGNOSIS — Z124 Encounter for screening for malignant neoplasm of cervix: Secondary | ICD-10-CM | POA: Diagnosis not present

## 2022-10-29 DIAGNOSIS — Z1389 Encounter for screening for other disorder: Secondary | ICD-10-CM | POA: Diagnosis not present

## 2022-11-04 DIAGNOSIS — Z419 Encounter for procedure for purposes other than remedying health state, unspecified: Secondary | ICD-10-CM | POA: Diagnosis not present

## 2022-12-02 DIAGNOSIS — A084 Viral intestinal infection, unspecified: Secondary | ICD-10-CM | POA: Diagnosis not present

## 2022-12-02 DIAGNOSIS — Z20822 Contact with and (suspected) exposure to covid-19: Secondary | ICD-10-CM | POA: Diagnosis not present

## 2022-12-02 DIAGNOSIS — J069 Acute upper respiratory infection, unspecified: Secondary | ICD-10-CM | POA: Diagnosis not present

## 2022-12-02 DIAGNOSIS — B86 Scabies: Secondary | ICD-10-CM | POA: Diagnosis not present

## 2022-12-02 DIAGNOSIS — R059 Cough, unspecified: Secondary | ICD-10-CM | POA: Diagnosis not present

## 2022-12-05 DIAGNOSIS — Z419 Encounter for procedure for purposes other than remedying health state, unspecified: Secondary | ICD-10-CM | POA: Diagnosis not present

## 2023-01-04 DIAGNOSIS — Z419 Encounter for procedure for purposes other than remedying health state, unspecified: Secondary | ICD-10-CM | POA: Diagnosis not present

## 2023-02-02 DIAGNOSIS — H539 Unspecified visual disturbance: Secondary | ICD-10-CM | POA: Diagnosis not present

## 2023-02-02 DIAGNOSIS — F331 Major depressive disorder, recurrent, moderate: Secondary | ICD-10-CM | POA: Diagnosis not present

## 2023-02-02 DIAGNOSIS — F902 Attention-deficit hyperactivity disorder, combined type: Secondary | ICD-10-CM | POA: Diagnosis not present

## 2023-02-02 DIAGNOSIS — Z1389 Encounter for screening for other disorder: Secondary | ICD-10-CM | POA: Diagnosis not present

## 2023-02-04 DIAGNOSIS — Z419 Encounter for procedure for purposes other than remedying health state, unspecified: Secondary | ICD-10-CM | POA: Diagnosis not present

## 2023-03-07 DIAGNOSIS — Z419 Encounter for procedure for purposes other than remedying health state, unspecified: Secondary | ICD-10-CM | POA: Diagnosis not present

## 2023-04-06 DIAGNOSIS — Z419 Encounter for procedure for purposes other than remedying health state, unspecified: Secondary | ICD-10-CM | POA: Diagnosis not present

## 2023-05-07 DIAGNOSIS — Z419 Encounter for procedure for purposes other than remedying health state, unspecified: Secondary | ICD-10-CM | POA: Diagnosis not present

## 2023-06-06 DIAGNOSIS — Z419 Encounter for procedure for purposes other than remedying health state, unspecified: Secondary | ICD-10-CM | POA: Diagnosis not present

## 2023-07-07 DIAGNOSIS — Z419 Encounter for procedure for purposes other than remedying health state, unspecified: Secondary | ICD-10-CM | POA: Diagnosis not present

## 2023-08-07 DIAGNOSIS — Z419 Encounter for procedure for purposes other than remedying health state, unspecified: Secondary | ICD-10-CM | POA: Diagnosis not present

## 2023-09-04 DIAGNOSIS — Z419 Encounter for procedure for purposes other than remedying health state, unspecified: Secondary | ICD-10-CM | POA: Diagnosis not present

## 2023-09-09 DIAGNOSIS — F331 Major depressive disorder, recurrent, moderate: Secondary | ICD-10-CM | POA: Diagnosis not present

## 2023-09-09 DIAGNOSIS — F902 Attention-deficit hyperactivity disorder, combined type: Secondary | ICD-10-CM | POA: Diagnosis not present

## 2023-09-09 DIAGNOSIS — Z1389 Encounter for screening for other disorder: Secondary | ICD-10-CM | POA: Diagnosis not present

## 2023-09-09 DIAGNOSIS — M79643 Pain in unspecified hand: Secondary | ICD-10-CM | POA: Diagnosis not present

## 2023-09-09 DIAGNOSIS — Z1331 Encounter for screening for depression: Secondary | ICD-10-CM | POA: Diagnosis not present

## 2023-10-16 DIAGNOSIS — Z419 Encounter for procedure for purposes other than remedying health state, unspecified: Secondary | ICD-10-CM | POA: Diagnosis not present

## 2023-11-08 DIAGNOSIS — F332 Major depressive disorder, recurrent severe without psychotic features: Secondary | ICD-10-CM | POA: Diagnosis not present

## 2023-11-11 DIAGNOSIS — F332 Major depressive disorder, recurrent severe without psychotic features: Secondary | ICD-10-CM | POA: Diagnosis not present

## 2023-11-11 DIAGNOSIS — F902 Attention-deficit hyperactivity disorder, combined type: Secondary | ICD-10-CM | POA: Diagnosis not present

## 2023-11-11 DIAGNOSIS — Z1389 Encounter for screening for other disorder: Secondary | ICD-10-CM | POA: Diagnosis not present

## 2023-11-15 DIAGNOSIS — Z419 Encounter for procedure for purposes other than remedying health state, unspecified: Secondary | ICD-10-CM | POA: Diagnosis not present

## 2023-12-16 DIAGNOSIS — Z419 Encounter for procedure for purposes other than remedying health state, unspecified: Secondary | ICD-10-CM | POA: Diagnosis not present

## 2024-01-15 DIAGNOSIS — Z419 Encounter for procedure for purposes other than remedying health state, unspecified: Secondary | ICD-10-CM | POA: Diagnosis not present

## 2024-02-15 DIAGNOSIS — Z419 Encounter for procedure for purposes other than remedying health state, unspecified: Secondary | ICD-10-CM | POA: Diagnosis not present

## 2024-03-14 DIAGNOSIS — F332 Major depressive disorder, recurrent severe without psychotic features: Secondary | ICD-10-CM | POA: Diagnosis not present

## 2024-03-14 DIAGNOSIS — F902 Attention-deficit hyperactivity disorder, combined type: Secondary | ICD-10-CM | POA: Diagnosis not present

## 2024-03-17 DIAGNOSIS — Z419 Encounter for procedure for purposes other than remedying health state, unspecified: Secondary | ICD-10-CM | POA: Diagnosis not present

## 2024-04-11 DIAGNOSIS — Z0000 Encounter for general adult medical examination without abnormal findings: Secondary | ICD-10-CM | POA: Diagnosis not present

## 2024-04-11 DIAGNOSIS — F339 Major depressive disorder, recurrent, unspecified: Secondary | ICD-10-CM | POA: Diagnosis not present

## 2024-04-11 DIAGNOSIS — F332 Major depressive disorder, recurrent severe without psychotic features: Secondary | ICD-10-CM | POA: Diagnosis not present

## 2024-04-11 DIAGNOSIS — F902 Attention-deficit hyperactivity disorder, combined type: Secondary | ICD-10-CM | POA: Diagnosis not present

## 2024-04-11 DIAGNOSIS — Z1389 Encounter for screening for other disorder: Secondary | ICD-10-CM | POA: Diagnosis not present

## 2024-05-25 DIAGNOSIS — F332 Major depressive disorder, recurrent severe without psychotic features: Secondary | ICD-10-CM | POA: Diagnosis not present

## 2024-05-25 DIAGNOSIS — Z1389 Encounter for screening for other disorder: Secondary | ICD-10-CM | POA: Diagnosis not present

## 2024-05-25 DIAGNOSIS — F902 Attention-deficit hyperactivity disorder, combined type: Secondary | ICD-10-CM | POA: Diagnosis not present
